# Patient Record
Sex: Male | Born: 1955 | Race: Black or African American | Hispanic: No | Marital: Married | Smoking: Current every day smoker
Health system: Southern US, Community
[De-identification: ages and names within clinical notes are randomized; demographics above are authoritative.]

## PROBLEM LIST (undated history)

## (undated) HISTORY — PX: MENISCUS REPAIR: SHX5179

## (undated) HISTORY — PX: OTHER SURGICAL HISTORY: SHX169

---

## 2009-07-25 ENCOUNTER — Emergency Department: Payer: Self-pay | Admitting: Emergency Medicine

## 2013-07-19 HISTORY — PX: COLONOSCOPY: SHX174

## 2017-08-15 ENCOUNTER — Other Ambulatory Visit: Payer: Self-pay

## 2017-08-15 ENCOUNTER — Emergency Department: Payer: Worker's Compensation

## 2017-08-15 ENCOUNTER — Emergency Department
Admission: EM | Admit: 2017-08-15 | Discharge: 2017-08-15 | Disposition: A | Discharge status: Home / Self Care | Payer: Worker's Compensation | Attending: Emergency Medicine | Admitting: Emergency Medicine

## 2017-08-15 ENCOUNTER — Encounter: Payer: Self-pay | Admitting: Emergency Medicine

## 2017-08-15 DIAGNOSIS — Y929 Unspecified place or not applicable: Secondary | ICD-10-CM | POA: Diagnosis not present

## 2017-08-15 DIAGNOSIS — S86001A Unspecified injury of right Achilles tendon, initial encounter: Secondary | ICD-10-CM | POA: Diagnosis not present

## 2017-08-15 DIAGNOSIS — Y999 Unspecified external cause status: Secondary | ICD-10-CM | POA: Diagnosis not present

## 2017-08-15 DIAGNOSIS — S99911A Unspecified injury of right ankle, initial encounter: Secondary | ICD-10-CM | POA: Diagnosis present

## 2017-08-15 DIAGNOSIS — Y9367 Activity, basketball: Secondary | ICD-10-CM | POA: Insufficient documentation

## 2017-08-15 DIAGNOSIS — F17200 Nicotine dependence, unspecified, uncomplicated: Secondary | ICD-10-CM | POA: Insufficient documentation

## 2017-08-15 DIAGNOSIS — Y33XXXA Other specified events, undetermined intent, initial encounter: Secondary | ICD-10-CM | POA: Diagnosis not present

## 2017-08-15 MED ORDER — HYDROCODONE-ACETAMINOPHEN 5-325 MG PO TABS
1.0000 | ORAL_TABLET | Freq: Once | ORAL | Status: AC
  Administered 2017-08-15: 1 via ORAL
  Filled 2017-08-15: qty 1

## 2017-08-15 MED ORDER — HYDROCODONE-ACETAMINOPHEN 5-325 MG PO TABS: 1.0000 | ORAL_TABLET | Freq: Four times a day (QID) | ORAL | 0 refills | Status: AC | PRN

## 2017-08-15 NOTE — ED Notes (Signed)
X-ray at bedside

## 2017-08-15 NOTE — ED Triage Notes (Signed)
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 EMS administered 100 mcg of Fentanyl pain.  Pt is awake alert and oriented.

## 2017-08-15 NOTE — ED Provider Notes (Signed)
[facility] Emergency Department Provider Note  ____________________________________________  Time seen: Approximately 9:23 PM  I have reviewed the triage vital signs and the nursing notes.   HISTORY  Chief Complaint Ankle Pain    HPI Erik [name] is a 62 y.o. male presents to the emergency department with right ankle pain.  Patient reports that he was demonstrating basketball moves when he suddenly felt posterior right ankle pain and heard a "pop".  Patient reports that he has been unable to bear weight since incident.  He denies weakness, radiculopathy or changes in sensation of the lower extremities.  No skin compromise.  No history of prior right ankle trauma.  He describes his pain as 8 out of 10 in intensity, nonradiating and aching.  No medications were attempted prior to presenting to the emergency department.   History reviewed. No pertinent past medical history.  There are no active problems to display for this patient.   History reviewed. No pertinent surgical history.  Prior to Admission medications   Medication Sig Start Date End Date Taking? Authorizing Provider  HYDROcodone-acetaminophen (NORCO) 5-325 MG tablet Take 1 tablet by mouth every 6 (six) hours as needed for up to 5 days for moderate pain. 08/15/17 08/20/17  Orvil FeilWoods, Lannie Yusuf M, PA-C    Allergies Patient has no allergy information on record.  No family history on file.  Social History Social History   Tobacco Use  . Smoking status: Current Every Day Smoker  Substance Use Topics  . Alcohol use: No    Frequency: Never  . Drug use: Not on file     Review of Systems  Constitutional: No fever/chills Eyes: No visual changes. No discharge ENT: No upper respiratory complaints. Cardiovascular: no chest pain. Respiratory: no cough. No SOB. Musculoskeletal: Patient has right ankle pain.  Skin: Negative for rash, abrasions, lacerations, ecchymosis. Neurological: No weakness or  numbness   ____________________________________________   PHYSICAL EXAM:  VITAL SIGNS: ED Triage Vitals  Enc Vitals Group     BP 08/15/17 1915 (!) 144/71     Pulse Rate 08/15/17 1915 76     Resp 08/15/17 1915 18     Temp 08/15/17 1915 98 F (36.7 C)     Temp Source 08/15/17 1915 Oral     SpO2 08/15/17 1915 99 %     Weight 08/15/17 1916 215 lb (97.5 kg)     Height 08/15/17 1916 5\' 11"  (1.803 m)     Head Circumference --      Peak Flow --      Pain Score 08/15/17 1916 6     Pain Loc --      Pain Edu? --      Excl. in GC? --      Constitutional: Alert and oriented. Well appearing and in no acute distress. Eyes: Conjunctivae are normal. PERRL. EOMI. Head: Atraumatic. Cardiovascular: Normal rate, regular rhythm. Normal S1 and S2.  Good peripheral circulation. Respiratory: Normal respiratory effort without tachypnea or retractions. Lungs CTAB. Good air entry to the bases with no decreased or absent breath sounds. Musculoskeletal: Patient is able to perform limited range of motion at the right ankle, likely secondary to pain.  He is able to move all 5 right toes.  Patient has a palpable deficit at the insertion of the Achilles tendon.  Tenderness elicited with palpation of the proximal calf.  Palpable dorsalis pedis pulse, right. Neurologic:  Normal speech and language. No [name] focal neurologic deficits are appreciated.  Skin:  Skin is warm, dry and intact. No rash noted.  ____________________________________________   LABS (all labs ordered are listed, but only abnormal results are displayed)  Labs Reviewed - No data to display ____________________________________________  EKG   ____________________________________________  RADIOLOGY Geraldo Pitter, personally viewed and evaluated these images (plain radiographs) as part of my medical decision making, as well as reviewing the written report by the radiologist.  Dg Ankle Complete Right  Result Date:  08/15/2017 CLINICAL DATA:  Right ankle pain EXAM: RIGHT ANKLE - COMPLETE 3+ VIEW COMPARISON:  None. FINDINGS: Soft tissue calcification posteriorly, possibly within the Achilles tendon, likely related to old injury. No acute bony abnormality. No fracture, subluxation or dislocation. Soft tissues are intact. IMPRESSION: Calcification posteriorly appears to be within the Achilles tendon, likely related to old injury. No acute bony abnormality. Electronically Signed   By: Charlett Nose M.D.   On: 08/15/2017 19:44    ____________________________________________    PROCEDURES  Procedure(s) performed:    Procedures    Medications  HYDROcodone-acetaminophen (NORCO/VICODIN) 5-325 MG per tablet 1 tablet (1 tablet Oral Given 08/15/17 2059)     ____________________________________________   INITIAL IMPRESSION / ASSESSMENT AND PLAN / ED COURSE  Pertinent labs & imaging results that were available during my care of the patient were reviewed by me and considered in my medical decision making (see chart for details).  Review of the [place] CSRS was performed in accordance of the [facility] prior to dispensing any controlled drugs.     Assessment and plan Achilles tendon rupture Patient presents to the emergency department with right ankle pain.  Differential diagnosis included fracture versus Achilles tendon rupture versus ankle sprain.  Physical exam findings were concerning for a palpable deficit at the insertion of the Achilles tendon.  No fractures or acute abnormalities were visualized on x-ray examination of the right ankle.  Patient was splinted in the emergency department.  Patient was discharged with Norco for pain.  A referral was given to orthopedics.  Return precautions were given.  All patient questions were answered.   ____________________________________________  FINAL CLINICAL IMPRESSION(S) / ED DIAGNOSES  Final diagnoses:  Injury of right Achilles tendon, initial encounter       NEW MEDICATIONS STARTED DURING THIS VISIT:  ED Discharge Orders        Ordered    HYDROcodone-acetaminophen (NORCO) 5-325 MG tablet  Every 6 hours PRN     08/15/17 2053          This chart was dictated using voice recognition software/Dragon. Despite best efforts to proofread, errors can occur which can change the meaning. Any change was purely unintentional.    Gasper Lloyd 08/15/17 2127    Jeanmarie Plant, MD 08/15/17 2145

## 2017-08-16 DIAGNOSIS — S86019A Strain of unspecified Achilles tendon, initial encounter: Secondary | ICD-10-CM | POA: Insufficient documentation

## 2017-08-16 DIAGNOSIS — Z720 Tobacco use: Secondary | ICD-10-CM | POA: Insufficient documentation

## 2017-08-29 DIAGNOSIS — S86919A Strain of unspecified muscle(s) and tendon(s) at lower leg level, unspecified leg, initial encounter: Secondary | ICD-10-CM | POA: Insufficient documentation

## 2018-06-08 IMAGING — DX DG ANKLE COMPLETE 3+V*R*
3 series · 3 of 3 positions shown · non-contrast
Comparison: None.

CLINICAL DATA: Right ankle pain

EXAM:
RIGHT ANKLE - COMPLETE 3+ VIEW

[ankle ap]
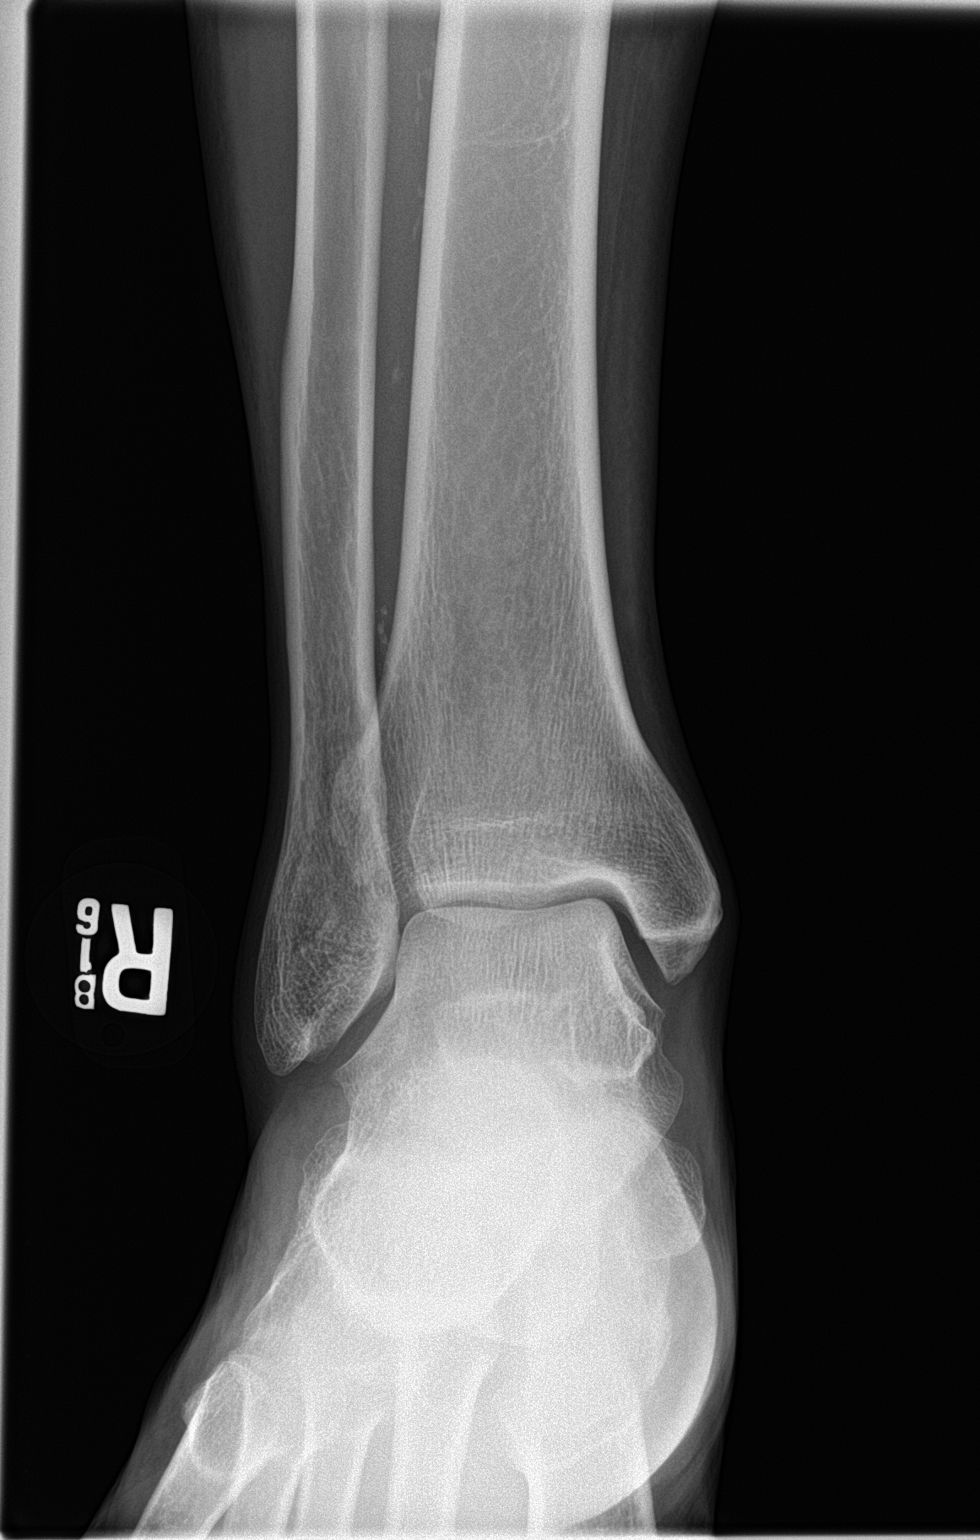

[ankle obl]
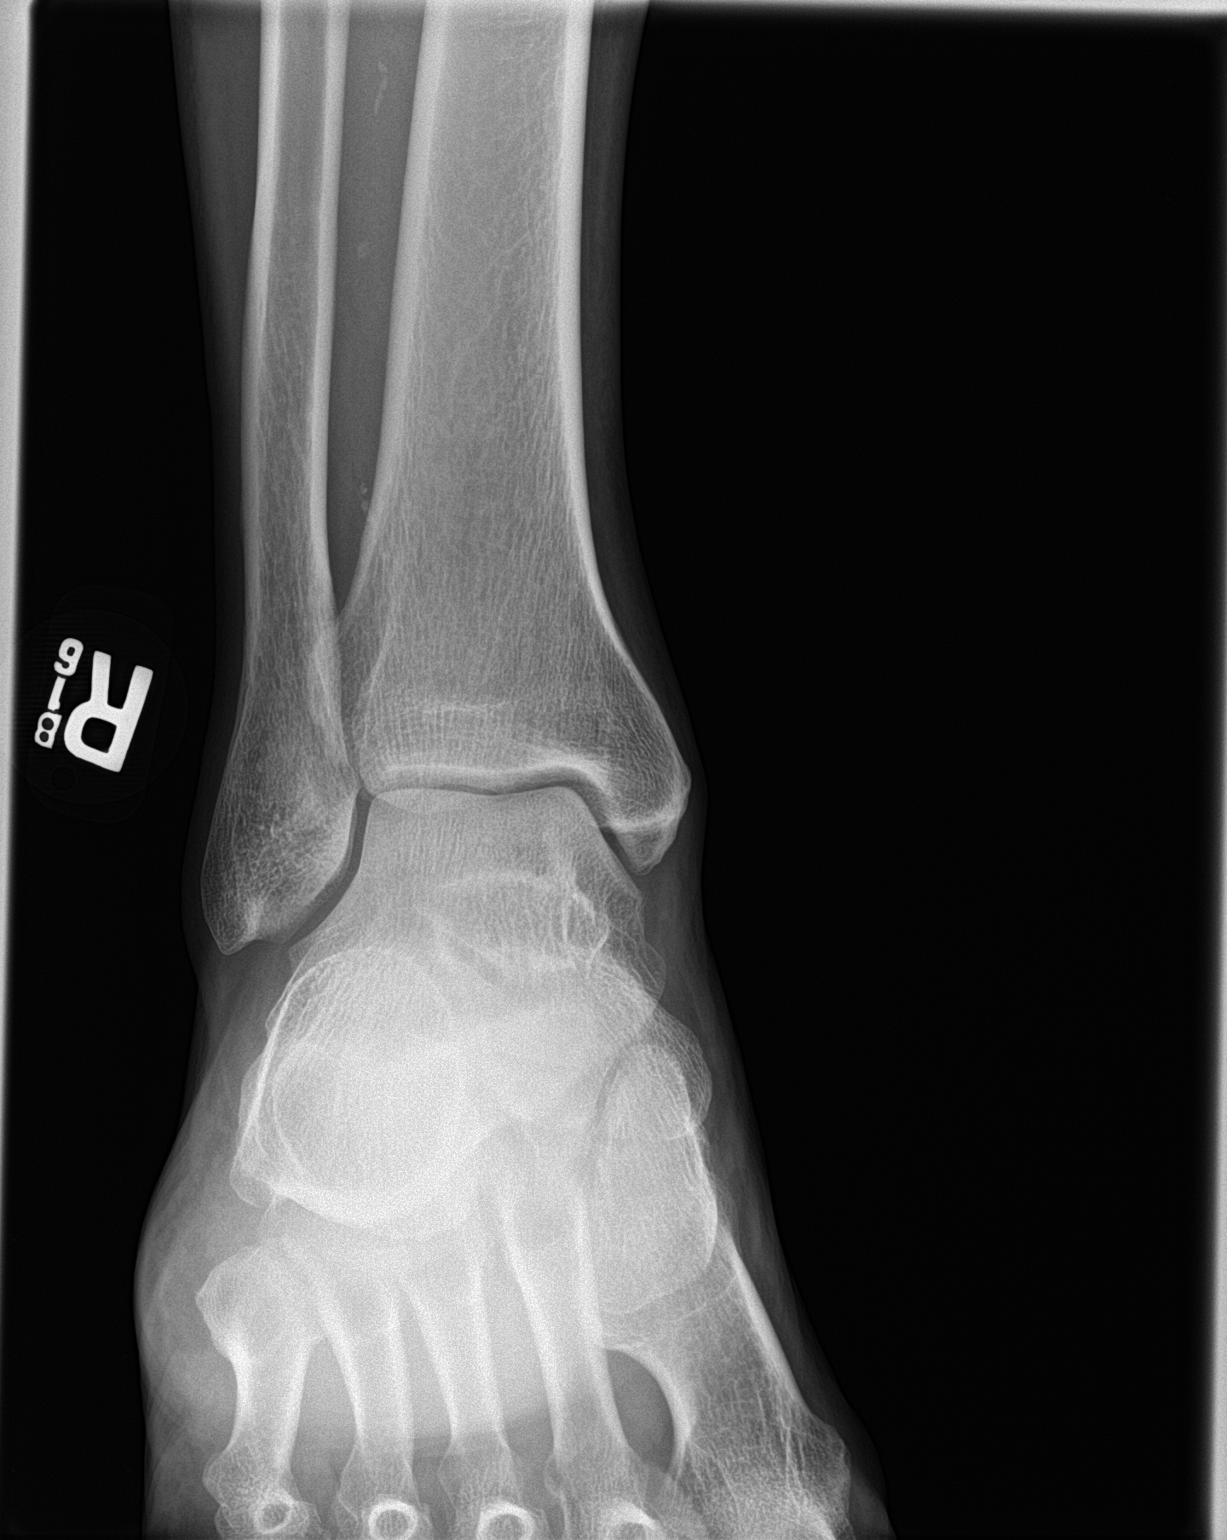

[ankle lat]
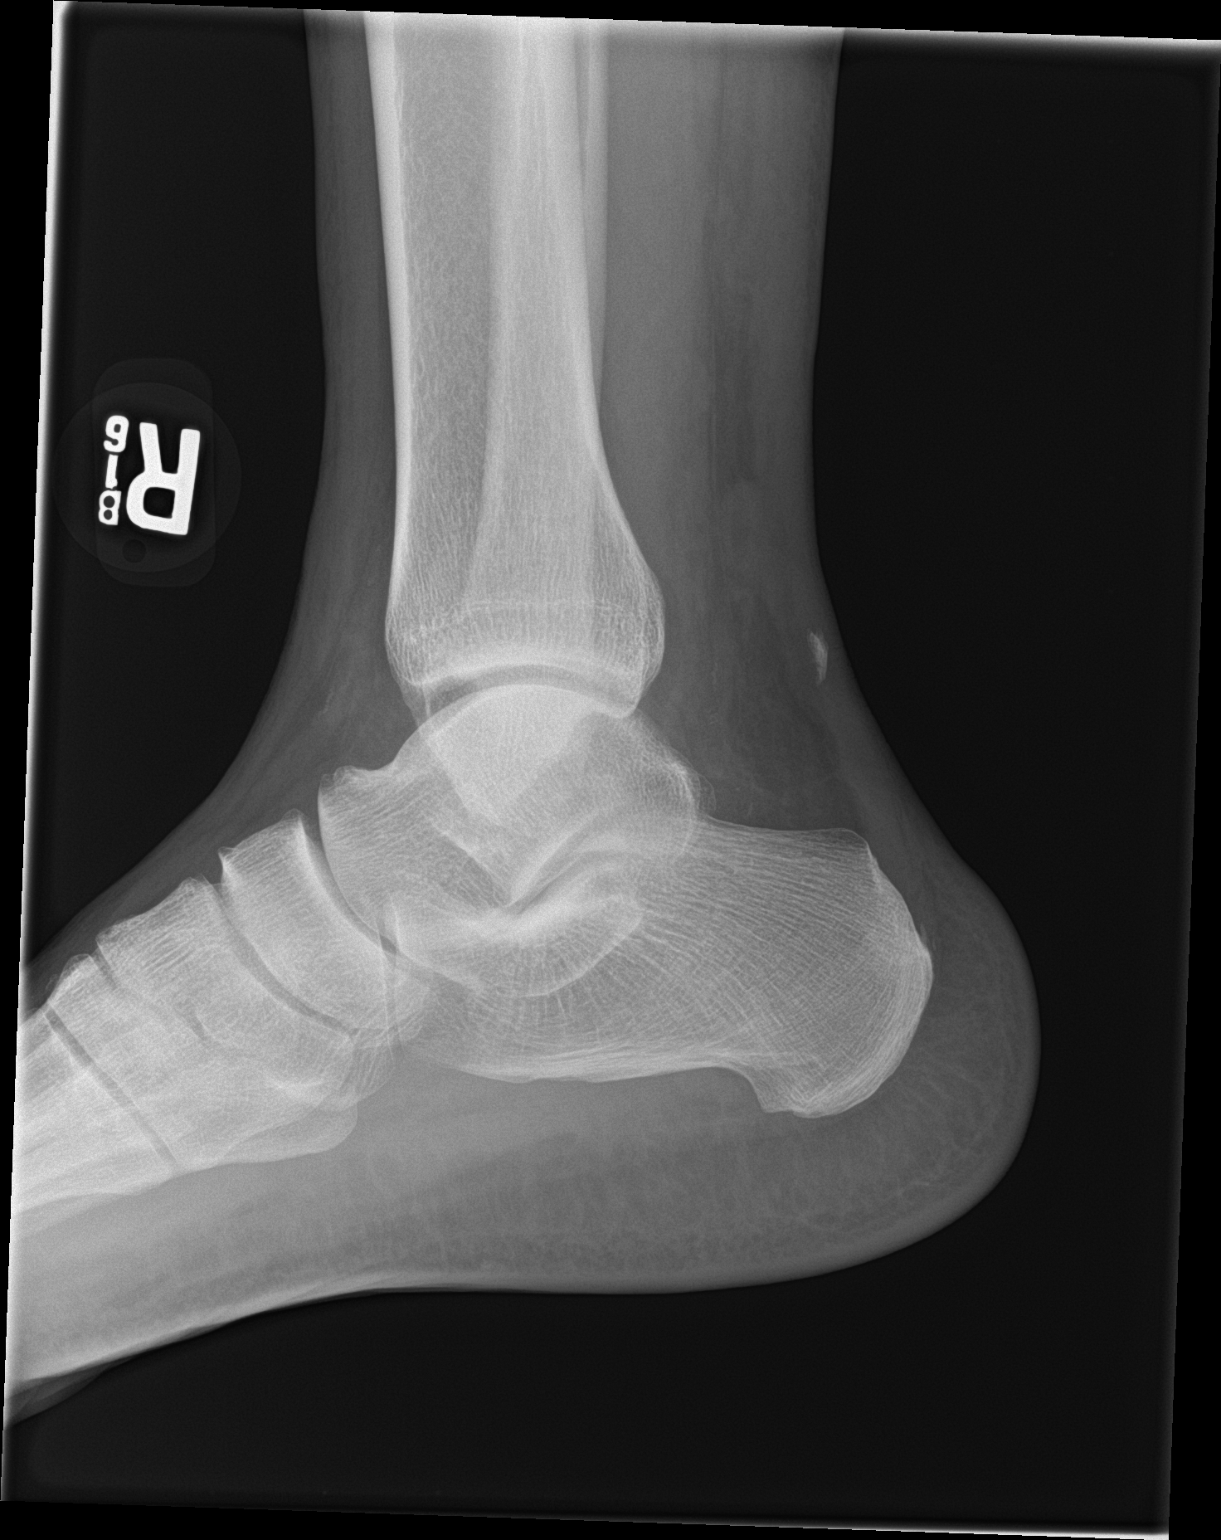

[3 of 3 positions shown; findings below may reference images not displayed]

FINDINGS: Soft tissue calcification posteriorly, possibly within the Achilles
tendon, likely related to old injury. No acute bony abnormality. No
fracture, subluxation or dislocation. Soft tissues are intact.
IMPRESSION: Calcification posteriorly appears to be within the Achilles tendon,
likely related to old injury.

No acute bony abnormality.

## 2019-07-30 ENCOUNTER — Other Ambulatory Visit: Payer: Self-pay

## 2019-07-30 ENCOUNTER — Encounter: Payer: Self-pay | Admitting: Occupational Medicine

## 2019-07-30 ENCOUNTER — Ambulatory Visit: Payer: Self-pay | Admitting: Occupational Medicine

## 2019-07-30 VITALS — BP 138/70 | HR 66 | Temp 98.0°F | Resp 16 | Ht 71.0 in | Wt 218.0 lb

## 2019-07-30 DIAGNOSIS — Z09 Encounter for follow-up examination after completed treatment for conditions other than malignant neoplasm: Secondary | ICD-10-CM

## 2019-07-30 DIAGNOSIS — Z0000 Encounter for general adult medical examination without abnormal findings: Secondary | ICD-10-CM

## 2019-07-30 DIAGNOSIS — E663 Overweight: Secondary | ICD-10-CM | POA: Insufficient documentation

## 2019-07-30 DIAGNOSIS — E119 Type 2 diabetes mellitus without complications: Secondary | ICD-10-CM

## 2019-07-30 DIAGNOSIS — H01139 Eczematous dermatitis of unspecified eye, unspecified eyelid: Secondary | ICD-10-CM | POA: Insufficient documentation

## 2019-07-30 DIAGNOSIS — N529 Male erectile dysfunction, unspecified: Secondary | ICD-10-CM | POA: Insufficient documentation

## 2019-07-30 DIAGNOSIS — D649 Anemia, unspecified: Secondary | ICD-10-CM | POA: Insufficient documentation

## 2019-07-30 DIAGNOSIS — E785 Hyperlipidemia, unspecified: Secondary | ICD-10-CM

## 2019-07-30 DIAGNOSIS — B351 Tinea unguium: Secondary | ICD-10-CM | POA: Insufficient documentation

## 2019-07-30 LAB — POCT URINALYSIS DIPSTICK
Bilirubin, UA: NEGATIVE
Blood, UA: NEGATIVE
Glucose, UA: NEGATIVE
Ketones, UA: NEGATIVE
Leukocytes, UA: NEGATIVE
Nitrite, UA: NEGATIVE
Protein, UA: NEGATIVE
Spec Grav, UA: >=1.03 — AB (ref 1.010–1.025)
Urobilinogen, UA: 0.2 E.U./dL
pH, UA: 6 (ref 5.0–8.0)

## 2019-07-30 LAB — POCT GLYCOSYLATED HEMOGLOBIN (HGB A1C)
HbA1c, POC (prediabetic range): 6.7 % — AB (ref 5.7–6.4)
Hemoglobin A1C: 6.7 % — AB (ref 4.0–5.6)

## 2019-07-30 LAB — GLUCOSE, POCT (MANUAL RESULT ENTRY): POC Glucose: 120 mg/dl — AB (ref 70–99)

## 2019-07-30 NOTE — Patient Instructions (Signed)
Thank you for coming to [name].  You have been diagnosed with Type 2 Diabetes Mellitus aka diabetes.  Blood work was performed today. We will call you with the results and discuss starting medication treatment.  We are going to schedule a meeting with a diabetic counselor to discuss diet and exercise changes, as well as, how to check your sugar (glucose) at home.  You have been scheduled for a follow-up appointment at the clinic for your annual physical and repeat check of your sugar (glucose) to check your progress.  Please feel free to call the clinic with any questions/concerns.  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  Diabetes Mellitus and Standards of Medical Care Managing diabetes (diabetes mellitus) can be complicated. Your diabetes treatment may be managed by a team of health care providers, including:  A physician who specializes in diabetes (endocrinologist).  A nurse practitioner or physician assistant.  Nurses.  A diet and nutrition specialist (registered dietitian).  A certified diabetes educator (CDE).  An exercise specialist.  A pharmacist.  An eye doctor.  A foot specialist (podiatrist).  A dentist.  A primary care provider.  A mental health provider. Your health care providers follow guidelines to help you get the best quality of care. The following schedule is a general guideline for your diabetes management plan. Your health care providers may give you more specific instructions. Physical exams Upon being diagnosed with diabetes mellitus, and each year after that, your health care provider will ask about your medical and family history. He or she will also do a physical exam. Your exam may include:  Measuring your height, weight,  and body mass index (BMI).  Checking your blood pressure. This will be done at every routine medical visit. Your target blood pressure may vary depending on your medical conditions, your age, and other factors.  Thyroid gland exam.  Skin exam.  Screening for damage to your nerves (peripheral neuropathy). This may include checking the pulse in your legs and feet and checking the level of sensation in your hands and feet.  A complete foot exam to inspect the structure and skin of your feet, including checking for cuts, bruises, redness, blisters, sores, or other problems.  Screening for blood vessel (vascular) problems, which may include checking the pulse in your legs and feet and checking your temperature. Blood tests Depending on your treatment plan and your personal needs, you may have the following tests done:  HbA1c (hemoglobin A1c). This test provides information about blood sugar (glucose) control over the previous 2-3 months. It is used to adjust your treatment plan, if needed. This test will be done: ? At least 2 times a year, if you are meeting your treatment goals. ? 4 times a year, if you are not meeting your treatment goals or if treatment goals have changed.  Lipid testing, including total, LDL, and HDL cholesterol and triglyceride levels. ? The goal for LDL is less than 100 mg/dL (5.5 mmol/L). If you are at high risk for complications, the goal is less than 70 mg/dL (3.9 mmol/L). ? The goal for HDL is 40 mg/dL (2.2 mmol/L) or higher for men and 50 mg/dL (2.8 mmol/L) or higher for women. An HDL cholesterol of 60 mg/dL (3.3 mmol/L) or higher gives some protection against  heart disease. ? The goal for triglycerides is less than 150 mg/dL (8.3 mmol/L).  Liver function tests.  Kidney function tests.  Thyroid function tests. Dental and eye exams  Visit your dentist two times a year.  If you have type 1 diabetes, your health care provider may recommend an eye exam 3-5 years  after you are diagnosed, and then once a year after your first exam. ? For children with type 1 diabetes, a health care provider may recommend an eye exam when your child is age 73 or older and has had diabetes for 3-5 years. After the first exam, your child should get an eye exam once a year.  If you have type 2 diabetes, your health care provider may recommend an eye exam as soon as you are diagnosed, and then once a year after your first exam. Immunizations   The yearly flu (influenza) vaccine is recommended for everyone 6 months or older who has diabetes.  The pneumonia (pneumococcal) vaccine is recommended for everyone 2 years or older who has diabetes. If you are 65 or older, you may get the pneumonia vaccine as a series of two separate shots.  The hepatitis B vaccine is recommended for adults shortly after being diagnosed with diabetes.  Adults and children with diabetes should receive all other vaccines according to age-specific recommendations from the Centers for Disease Control and Prevention (CDC). Mental and emotional health Screening for symptoms of eating disorders, anxiety, and depression is recommended at the time of diagnosis and afterward as needed. If your screening shows that you have symptoms (positive screening result), you may need more evaluation and you may work with a mental health care provider. Treatment plan Your treatment plan will be reviewed at every medical visit. You and your health care provider will discuss:  How you are taking your medicines, including insulin.  Any side effects you are experiencing.  Your blood glucose target goals.  The frequency of your blood glucose monitoring.  Lifestyle habits, such as activity level as well as tobacco, alcohol, and substance use. Diabetes self-management education Your health care provider will assess how well you are monitoring your blood glucose levels and whether you are taking your insulin correctly. He or  she may refer you to:  A certified diabetes educator to manage your diabetes throughout your life, starting at diagnosis.  A registered dietitian who can create or review your personal nutrition plan.  An exercise specialist who can discuss your activity level and exercise plan. Summary  Managing diabetes (diabetes mellitus) can be complicated. Your diabetes treatment may be managed by a team of health care providers.  Your health care providers follow guidelines in order to help you get the best quality of care.  Standards of care including having regular physical exams, blood tests, blood pressure monitoring, immunizations, screening tests, and education about how to manage your diabetes.  Your health care providers may also give you more specific instructions based on your individual health. This information is not intended to replace advice given to you by your health care provider. Make sure you discuss any questions you have with your health care provider. Document Revised: 03/24/2018 Document Reviewed: 04/02/2016 Elsevier Patient Education  2020 [employer].   Carbohydrate Counting for Diabetes Mellitus, Adult  Carbohydrate counting is a method of keeping track of how many carbohydrates you eat. Eating carbohydrates naturally increases the amount of sugar (glucose) in the blood. Counting how many carbohydrates you eat helps keep your blood glucose within  normal limits, which helps you manage your diabetes (diabetes mellitus). It is important to know how many carbohydrates you can safely have in each meal. This is different for every person. A diet and nutrition specialist (registered dietitian) can help you make a meal plan and calculate how many carbohydrates you should have at each meal and snack. Carbohydrates are found in the following foods:  Grains, such as breads and cereals.  Dried beans and soy products.  Starchy vegetables, such as potatoes, peas, and corn.  Fruit  and fruit juices.  Milk and yogurt.  Sweets and snack foods, such as cake, cookies, candy, chips, and soft drinks. How do I count carbohydrates? There are two ways to count carbohydrates in food. You can use either of the methods or a combination of both. Reading "Nutrition Facts" on packaged food The "Nutrition Facts" list is included on the labels of almost all packaged foods and beverages in the [place] It includes:  The serving size.  Information about nutrients in each serving, including the grams (g) of carbohydrate per serving. To use the "Nutrition Facts":  Decide how many servings you will have.  Multiply the number of servings by the number of carbohydrates per serving.  The resulting number is the total amount of carbohydrates that you will be having. Learning standard serving sizes of other foods When you eat carbohydrate foods that are not packaged or do not include "Nutrition Facts" on the label, you need to measure the servings in order to count the amount of carbohydrates:  Measure the foods that you will eat with a food scale or measuring cup, if needed.  Decide how many standard-size servings you will eat.  Multiply the number of servings by 15. Most carbohydrate-rich foods have about 15 g of carbohydrates per serving. ? For example, if you eat 8 oz (170 g) of strawberries, you will have eaten 2 servings and 30 g of carbohydrates (2 servings x 15 g = 30 g).  For foods that have more than one food mixed, such as soups and casseroles, you must count the carbohydrates in each food that is included. The following list contains standard serving sizes of common carbohydrate-rich foods. Each of these servings has about 15 g of carbohydrates:   hamburger bun or  English muffin.   oz (15 mL) syrup.   oz (14 g) jelly.  1 slice of bread.  1 six-inch tortilla.  3 oz (85 g) cooked rice or pasta.  4 oz (113 g) cooked dried beans.  4 oz (113 g) starchy vegetable,  such as peas, corn, or potatoes.  4 oz (113 g) hot cereal.  4 oz (113 g) mashed potatoes or  of a large baked potato.  4 oz (113 g) canned or frozen fruit.  4 oz (120 mL) fruit juice.  4-6 crackers.  6 chicken nuggets.  6 oz (170 g) unsweetened dry cereal.  6 oz (170 g) plain fat-free yogurt or yogurt sweetened with artificial sweeteners.  8 oz (240 mL) milk.  8 oz (170 g) fresh fruit or one small piece of fruit.  24 oz (680 g) popped popcorn. Example of carbohydrate counting Sample meal  3 oz (85 g) chicken breast.  6 oz (170 g) brown rice.  4 oz (113 g) corn.  8 oz (240 mL) milk.  8 oz (170 g) strawberries with sugar-free whipped topping. Carbohydrate calculation 1. Identify the foods that contain carbohydrates: ? Rice. ? Corn. ? Milk. ? Strawberries. 2. Calculate how  many servings you have of each food: ? 2 servings rice. ? 1 serving corn. ? 1 serving milk. ? 1 serving strawberries. 3. Multiply each number of servings by 15 g: ? 2 servings rice x 15 g = 30 g. ? 1 serving corn x 15 g = 15 g. ? 1 serving milk x 15 g = 15 g. ? 1 serving strawberries x 15 g = 15 g. 4. Add together all of the amounts to find the total grams of carbohydrates eaten: ? 30 g + 15 g + 15 g + 15 g = 75 g of carbohydrates total. Summary  Carbohydrate counting is a method of keeping track of how many carbohydrates you eat.  Eating carbohydrates naturally increases the amount of sugar (glucose) in the blood.  Counting how many carbohydrates you eat helps keep your blood glucose within normal limits, which helps you manage your diabetes.  A diet and nutrition specialist (registered dietitian) can help you make a meal plan and calculate how many carbohydrates you should have at each meal and snack. This information is not intended to replace advice given to you by your health care provider. Make sure you discuss any questions you have with your health care provider. Document  Revised: 01/27/2017 Document Reviewed: 12/17/2015 Elsevier Patient Education  2020 [employer].

## 2019-07-30 NOTE — Progress Notes (Signed)
Patient ID: Erik [name] DOB: September 01, 1955 AGE: 64 y.o. MRN: [id number]   PCP: No primary care provider on file.   Chief Complaint:  Chief Complaint  Patient presents with  . Follow-up    Elevated BS at [employer] Physical - A1c Now Followup & Form  . Covid Screening    No Symptoms     Subjective:    HPI:  Erik [name] is a 64 y.o. male presents for evaluation  Chief Complaint  Patient presents with  . Follow-up    Elevated BS at [employer] Physical - A1c Now Followup & Form  . 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 Physical performed at [facility] in [place], [place]. Glycosuria noted. Random blood glucose was 220. Patient given form to have completed by PCP.  Patient last seen at [facility] May 2019 for annual physical. A1C 5.7. Fasting blood glucose 117.  Patient has noticed polyuria and nocturia. Denies headache, dizziness/lightheadedness, change in vision, chest pain, SOB, hematuria, nausea/vomiting, abdominal pain, gait abnormality, extremity weakness. Patient does report occasional tingling in fingers and toes; not necessarily worse over past 1-2 years.  Patient admits to poor diet. Eats bread frequently; almost every meal. Unsure if DM2 runs in his family.  A limited review of symptoms was performed, pertinent positives and negatives as mentioned in HPI.  The following portions of the patient's history were reviewed and updated as appropriate: allergies, current medications and past medical history.  Patient Active Problem List   Diagnosis Date Noted  . Overweight 07/30/2019  . Anemia 07/30/2019  . Hyperlipidemia 07/30/2019  . Erectile dysfunction 07/30/2019  . Eczematous dermatitis of eyelid 07/30/2019  . Tinea unguium 07/30/2019  . Muscle strain of lower extremity 08/29/2017  . Strain of Achilles tendon 08/16/2017  .  Tobacco user 08/16/2017    Allergies  Allergen Reactions  . Bee Venom Swelling    No current outpatient medications on file prior to visit.   No current facility-administered medications on file prior to visit.       Objective:   Vitals:   07/30/19 0938  BP: 138/70  Pulse: 66  Resp: 16  Temp: 98 F (36.7 C)  SpO2: 98%     Wt Readings from Last 3 Encounters:  07/30/19 218 lb (98.9 kg)  08/15/17 215 lb (97.5 kg)    Physical Exam:   General Appearance:  Patient sitting comfortably on examination table. Conversational. Peri Jefferson self-historian. In no acute distress. Afebrile.   Head:  Normocephalic, without obvious abnormality, atraumatic  Eyes:  PERRL, conjunctiva/corneas clear, EOM's intact  Ears:  Left ear canal WNL. No erythema or edema. No open wound. No visible purulent drainage. No tenderness with palpation over left tragus or with manipulation of left auricle. No visible erythema or edema of left mastoid. No tenderness with palpation over left mastoid. Right ear canal WNL. No erythema or edema. No open wound. No visible purulent drainage. No tenderness with palpation over right tragus or with manipulation of right auricle. No visible erythema or edema of right mastoid. No tenderness with palpation over right mastoid. Left TM WNL. Good light reflex. Visible landmarks. No erythema. No injection. No bulging or retraction. No visible perforation. No serous effusion. No visible purulent effusion. No tympanostomy tube. No scar tissue. Right TM WNL. Good light reflex. Visible landmarks. No erythema. No injection.  No bulging or retraction. No visible perforation. No serous effusion. No visible purulent effusion. No tympanostomy tube. No scar tissue.  Nose: Nares normal. Septum midline. No visible polyps. No discharge. Normal mucosa. No sinus tenderness with percussion/palpation.  Throat: Lips, mucosa, and tongue normal; teeth and gums normal. Throat reveals no erythema. No postnasal  drip. No visible cobblestoning. Tonsils with no enlargement or exudate. Uvula midline with no edema or erythema.  Neck: Supple, symmetrical, trachea midline, no adenopathy  Lungs:   Clear to auscultation bilaterally, respirations unlabored. Good aeration. No rales, rhonchi, crackles or wheezing.  Heart:  Regular rate and rhythm, S1 and S2 normal, no murmur, rub, or gallop  Abdomen:   Normal to inspection. Normoactive bowel sounds. No tenderness with palpation. No guarding, rigidity or rebound tenderness. No palpable organomegaly.  Extremities: Extremities normal, atraumatic, no cyanosis or edema  Pulses: 2+ and symmetric  Skin: Skin color, texture, turgor normal, no rashes or lesions  Lymph nodes: Cervical, supraclavicular, and axillary nodes normal  Neurologic: Normal    Assessment & Plan:    Exam findings, diagnosis etiology and medication use and indications reviewed with patient. Follow-Up and discharge instructions provided. No emergent/urgent issues found on exam.  Patient education was provided.   Patient verbalized understanding of information provided and agrees with plan of care (POC), all questions answered. The patient is advised to call or return to clinic if condition does not see an improvement in symptoms, or to seek the care of the closest emergency department if condition worsens with the below plan.   Orders Placed This Encounter  Procedures  . POCT HgB A1C (CPT 83036)  . POCT glucose (manual entry)    Results for orders placed or performed in visit on 07/30/19  POCT HgB A1C (CPT 83036)  Result Value Ref Range   Hemoglobin A1C 6.7 (A) 4.0 - 5.6 %   HbA1c POC (<> result, manual entry)     HbA1c, POC (prediabetic range) 6.7 (A) 5.7 - 6.4 %   HbA1c, POC (controlled diabetic range)    POCT glucose (manual entry)  Result Value Ref Range   POC Glucose 120 (A) 70 - 99 mg/dl    1. Follow up - POCT HgB A1C (CPT 83036) - POCT glucose (manual entry)  2. Type 2  diabetes mellitus without complication, without long-term current use of insulin ([place])  3. Hyperlipidemia, unspecified hyperlipidemia type  4. Overweight  5. Routine adult health maintenance - Male Executive Panel - POCT Urinalysis Dipstick (CPT 81002) - Microalbumin / creatinine urine ratio  Patient with new diagnosis of Type 2 Diabetes Mellitus.  Blood work ordered; if GFR/BUN/Creatinine WNL (no proteinuria on UA performed today), patient can be started on Metformin XR 500mg  qd (discussed with patient, he would prefer qd as opposed to bid dosing).  May wish to discuss starting Lisinopril 5mg  qd (kidney protection), statin (if cholesterol elevated) and ASA 81mg  qd.  Patient referred to diabetic counselor/nutritionist; to discuss diet/carbohydrate counting, use of glucometer, and diabetes lifestyle basics.  Discussed with patient weight loss and smoking cessation.  Completed [employer] form.  Advised patient call clinic with any concerns. Annual physical scheduled for May 2021 (can also recheck BMP and A1C). Will need to discuss need for optometry/ophthalmology appt and possibly podiatry appt.   Darlin Priestly, MHS, PA-C Montey Hora, MHS, PA-C Advanced Practice Provider [facility]  [address] [place], [place] [address] (p): [phone] [email or web address]

## 2019-07-31 LAB — CMP12+LP+TP+TSH+6AC+PSA+CBC…
ALT: 38 IU/L (ref 0–44)
AST: 41 IU/L — ABNORMAL HIGH (ref 0–40)
Albumin/Globulin Ratio: 1.6 (ref 1.2–2.2)
Albumin: 4.7 g/dL (ref 3.8–4.8)
Alkaline Phosphatase: 84 IU/L (ref 39–117)
BUN/Creatinine Ratio: 14 (ref 10–24)
BUN: 15 mg/dL (ref 8–27)
Basophils Absolute: 0 10*3/uL (ref 0.0–0.2)
Basos: 1 %
Bilirubin Total: 0.3 mg/dL (ref 0.0–1.2)
Calcium: 9.8 mg/dL (ref 8.6–10.2)
Chloride: 103 mmol/L (ref 96–106)
Chol/HDL Ratio: 4.2 ratio (ref 0.0–5.0)
Cholesterol, Total: 176 mg/dL (ref 100–199)
Creatinine, Ser: 1.05 mg/dL (ref 0.76–1.27)
EOS (ABSOLUTE): 0.2 10*3/uL (ref 0.0–0.4)
Eos: 2 %
Estimated CHD Risk: 0.8 times avg. (ref 0.0–1.0)
Free Thyroxine Index: 1.7 (ref 1.2–4.9)
GFR calc Af Amer: 87 mL/min/{1.73_m2} (ref >59)
GFR calc non Af Amer: 75 mL/min/{1.73_m2} (ref >59)
GGT: 134 IU/L — ABNORMAL HIGH (ref 0–65)
Globulin, Total: 2.9 g/dL (ref 1.5–4.5)
Glucose: 118 mg/dL — ABNORMAL HIGH (ref 65–99)
HDL: 42 mg/dL (ref >39)
Hematocrit: 37.9 % (ref 37.5–51.0)
Hemoglobin: 12.8 g/dL — ABNORMAL LOW (ref 13.0–17.7)
Immature Grans (Abs): 0 10*3/uL (ref 0.0–0.1)
Immature Granulocytes: 0 %
Iron: 97 ug/dL (ref 38–169)
LDH: 227 IU/L — ABNORMAL HIGH (ref 121–224)
LDL Chol Calc (NIH): 115 mg/dL — ABNORMAL HIGH (ref 0–99)
Lymphocytes Absolute: 3.7 10*3/uL — ABNORMAL HIGH (ref 0.7–3.1)
Lymphs: 44 %
MCH: 29.3 pg (ref 26.6–33.0)
MCHC: 33.8 g/dL (ref 31.5–35.7)
MCV: 87 fL (ref 79–97)
Monocytes Absolute: 0.6 10*3/uL (ref 0.1–0.9)
Monocytes: 7 %
Neutrophils Absolute: 3.9 10*3/uL (ref 1.4–7.0)
Neutrophils: 46 %
Phosphorus: 3.7 mg/dL (ref 2.8–4.1)
Platelets: 288 10*3/uL (ref 150–450)
Potassium: 5 mmol/L (ref 3.5–5.2)
Prostate Specific Ag, Serum: 0.8 ng/mL (ref 0.0–4.0)
RBC: 4.37 x10E6/uL (ref 4.14–5.80)
RDW: 13.2 % (ref 11.6–15.4)
Sodium: 142 mmol/L (ref 134–144)
T3 Uptake Ratio: 22 % — ABNORMAL LOW (ref 24–39)
T4, Total: 7.5 ug/dL (ref 4.5–12.0)
TSH: 1.52 u[IU]/mL (ref 0.450–4.500)
Total Protein: 7.6 g/dL (ref 6.0–8.5)
Triglycerides: 101 mg/dL (ref 0–149)
Uric Acid: 7.2 mg/dL (ref 3.8–8.4)
VLDL Cholesterol Cal: 19 mg/dL (ref 5–40)
WBC: 8.3 10*3/uL (ref 3.4–10.8)

## 2019-07-31 LAB — MICROALBUMIN / CREATININE URINE RATIO
Creatinine, Urine: 166.6 mg/dL
Microalb/Creat Ratio: 12 mg/g creat (ref 0–29)
Microalbumin, Urine: 19.9 ug/mL

## 2019-08-13 ENCOUNTER — Ambulatory Visit: Payer: Self-pay | Admitting: Occupational Medicine

## 2019-08-13 ENCOUNTER — Other Ambulatory Visit: Payer: Self-pay

## 2019-08-13 VITALS — BP 130/80 | HR 68 | Temp 97.8°F | Ht 71.0 in | Wt 216.6 lb

## 2019-08-13 DIAGNOSIS — E119 Type 2 diabetes mellitus without complications: Secondary | ICD-10-CM

## 2019-08-13 DIAGNOSIS — E785 Hyperlipidemia, unspecified: Secondary | ICD-10-CM

## 2019-08-13 MED ORDER — ASPIRIN EC 81 MG PO TBEC: 81.0000 mg | DELAYED_RELEASE_TABLET | Freq: Every day | ORAL | 1 refills | Status: AC

## 2019-08-13 MED ORDER — METFORMIN HCL ER 500 MG PO TB24: 500.0000 mg | ORAL_TABLET | Freq: Every day | ORAL | 1 refills | Status: AC

## 2019-08-13 MED ORDER — LISINOPRIL 5 MG PO TABS: 5.0000 mg | ORAL_TABLET | Freq: Every day | ORAL | 1 refills | Status: AC

## 2019-08-13 MED ORDER — ATORVASTATIN CALCIUM 20 MG PO TABS: 20.0000 mg | ORAL_TABLET | Freq: Every day | ORAL | 1 refills | Status: AC

## 2019-08-13 NOTE — Progress Notes (Signed)
Patient ID: Erik [name] DOB: 1956/04/21 AGE: 64 y.o. MRN: [id number]   PCP: No primary care provider on file.   Chief Complaint:  Chief Complaint  Patient presents with  . Annual Exam  . [id number] screening    no symptoms     Subjective:    HPI:  Erik [name] is a 64 y.o. male presents for evaluation  Chief Complaint  Patient presents with  . Annual Exam  . [phone] screening    no symptoms    64 year old male returns to [facility] for discussion of DM2 management. 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  Patient seen at [place] clinic on 07/30/2019. A1C 6.7. Ordered additional blood work for further evaluation.  Patient with hyperlipidemia (LDL 115) and elevated blood pressure (past three readings: 138/70, 141/68, 153/73).  Patient states he feels well. Over past 2 weeks, since last office visit, has decreased simple carbohydrate intake. States he has not heard about nutritionist/diabetic counseling appointment yet.  Patient denies fever, chills, headache, dizziness/lightheadedness, chest pain, SOB, palpitations, abdominal pain, nausea/vomiting, diarrhea, rashes. Patient with continued occasional/rare tingling in fingers and toes; not necessarily worse over past 1-2 years.  A limited review of symptoms was performed, pertinent positives and negatives as mentioned in HPI.  The following portions of the patient's history were reviewed and updated as appropriate: allergies, current medications and past medical history.  Patient Active Problem List   Diagnosis Date Noted  . Overweight 07/30/2019  . Anemia 07/30/2019  . Hyperlipidemia 07/30/2019  . Erectile dysfunction 07/30/2019  . Eczematous dermatitis of eyelid 07/30/2019  . Tinea unguium 07/30/2019  . Muscle strain of lower extremity 08/29/2017  . Strain of Achilles tendon  08/16/2017  . Tobacco user 08/16/2017    Allergies  Allergen Reactions  . Bee Venom Swelling    No current outpatient medications on file prior to visit.   No current facility-administered medications on file prior to visit.       Objective:   Vitals:   08/13/19 1112  BP: 130/80  Pulse: 68  Temp: 97.8 F (36.6 C)  SpO2: 99%     Wt Readings from Last 3 Encounters:  08/13/19 216 lb 9.6 oz (98.2 kg)  07/30/19 218 lb (98.9 kg)  08/15/17 215 lb (97.5 kg)    Physical Exam:   General Appearance:  Patient sitting comfortably on examination table. Conversational. Kermit Balo self-historian. In no acute distress. Afebrile.   Head:  Normocephalic, without obvious abnormality, atraumatic  Eyes:  PERRL, conjunctiva/corneas clear, EOM's intact  Neck: Supple, symmetrical, trachea midline, no adenopathy  Lungs:   Clear to auscultation bilaterally, respirations unlabored. Good aeration. No rales, rhonchi, crackles or wheezing.  Heart:  Regular rate and rhythm, S1 and S2 normal, no murmur, rub, or gallop  Extremities: Extremities normal, atraumatic, no cyanosis or edema  Pulses: 2+ and symmetric  Skin: Skin color, texture, turgor normal, no rashes or lesions  Lymph nodes: Cervical, supraclavicular, and axillary nodes normal  Neurologic: Normal    Assessment & Plan:    Exam findings, diagnosis etiology and medication use and indications reviewed with patient. Follow-Up and discharge instructions provided. No emergent/urgent issues found on exam.  Patient education was provided.   Patient verbalized understanding of information provided and agrees with plan of care (POC), all questions answered. The patient is advised to call or return  to clinic if condition does not see an improvement in symptoms, or to seek the care of the closest emergency department if condition worsens with the below plan.    1. Type 2 diabetes mellitus without complication, without long-term current use of insulin  (HCC)  - metFORMIN (GLUCOPHAGE-XR) 500 MG 24 hr tablet; Take 1 tablet (500 mg total) by mouth daily with breakfast.  Dispense: 90 tablet; Refill: 1 - lisinopril (ZESTRIL) 5 MG tablet; Take 1 tablet (5 mg total) by mouth daily.  Dispense: 90 tablet; Refill: 1  2. Hyperlipidemia, unspecified hyperlipidemia type  - atorvastatin (LIPITOR) 20 MG tablet; Take 1 tablet (20 mg total) by mouth daily.  Dispense: 90 tablet; Refill: 38  64 year old male returns to [facility] clinic for initiating of DM2 management.  Baseline 10 years ASCVD Risk = 28.3% (AHA/ACC calculations). Advises initiating statin treatment. Patient with advanced age, African American race, elevated BP, elevated LDL, and current cigarette smoker.  Started Metformin XR 500mg  qd, Lisinopril 5mg  qd, Atorvastatin 20mg  qd, and ASA 81mg  qd. Follow-up scheduled in 3 months for re-evaluation; repeat A1C, BMP, and lipids.  At follow-up appointment, may wish to discuss dental exam, ophthalmology appt and/or podiatry evaluation.   , MHS, PA-C , MHS, PA-C Advanced Practice Provider [facility]  [phone] [place], [place] [facility] (p): [phone] [email or web address]

## 2019-11-13 ENCOUNTER — Other Ambulatory Visit: Payer: Managed Care, Other (non HMO)

## 2019-11-16 NOTE — Progress Notes (Signed)
Lennette Bihari, [place] (Attending) [phone] (Work) [phone] (Fax) [address] [place], [place] [address] Family Medicine  Turns 65 09/18/20 & established PCM with Lennette Bihari, PA.  Presents today for follow-up labs (A1c, BMP & Lipid Panel)  requested by Janalyn Harder, PA when he completed physical through [facility]. Has appointment scheduled for 11/26/19 to review lab results with Durward Parcel, PA-C  AMD

## 2019-11-19 ENCOUNTER — Other Ambulatory Visit: Payer: Self-pay

## 2019-11-19 ENCOUNTER — Ambulatory Visit: Payer: Managed Care, Other (non HMO)

## 2019-11-19 DIAGNOSIS — E785 Hyperlipidemia, unspecified: Secondary | ICD-10-CM

## 2019-11-19 DIAGNOSIS — E119 Type 2 diabetes mellitus without complications: Secondary | ICD-10-CM

## 2019-11-20 LAB — BASIC METABOLIC PANEL
BUN/Creatinine Ratio: 12 (ref 10–24)
BUN: 18 mg/dL (ref 8–27)
CO2: 25 mmol/L (ref 20–29)
Calcium: 9.9 mg/dL (ref 8.6–10.2)
Chloride: 102 mmol/L (ref 96–106)
Creatinine, Ser: 1.49 mg/dL — ABNORMAL HIGH (ref 0.76–1.27)
GFR calc Af Amer: 57 mL/min/{1.73_m2} — ABNORMAL LOW (ref >59)
GFR calc non Af Amer: 49 mL/min/{1.73_m2} — ABNORMAL LOW (ref >59)
Glucose: 97 mg/dL (ref 65–99)
Potassium: 4.4 mmol/L (ref 3.5–5.2)
Sodium: 139 mmol/L (ref 134–144)

## 2019-11-20 LAB — LIPID PANEL W/O CHOL/HDL RATIO
Cholesterol, Total: 92 mg/dL — ABNORMAL LOW (ref 100–199)
HDL: 37 mg/dL — ABNORMAL LOW (ref >39)
LDL Chol Calc (NIH): 36 mg/dL (ref 0–99)
Triglycerides: 97 mg/dL (ref 0–149)
VLDL Cholesterol Cal: 19 mg/dL (ref 5–40)

## 2019-11-20 LAB — HGB A1C W/O EAG: Hgb A1c MFr Bld: 6.7 % — ABNORMAL HIGH (ref 4.8–5.6)

## 2019-11-26 ENCOUNTER — Encounter: Payer: Self-pay | Admitting: Physician Assistant

## 2019-11-26 ENCOUNTER — Ambulatory Visit: Payer: Self-pay | Admitting: Physician Assistant

## 2019-11-26 ENCOUNTER — Other Ambulatory Visit: Payer: Self-pay

## 2019-11-26 VITALS — BP 142/74 | HR 64 | Temp 97.9°F | Resp 12 | Ht 71.0 in | Wt 210.0 lb

## 2019-11-26 DIAGNOSIS — Z712 Person consulting for explanation of examination or test findings: Secondary | ICD-10-CM

## 2019-11-26 NOTE — Progress Notes (Signed)
Labs (Lipid Panel, BMP & A1c) collected on 11/19/19.  AMD

## 2019-11-26 NOTE — Progress Notes (Signed)
   Subjective: Annual physical exam    Patient ID: Erik [name], male    DOB: 02-29-1956, 64 y.o.   MRN: [id number]  HPI Patient presents for annual physical exam.  Patient voices no concerns or complaints.   Review of Systems Diabetes, erectile dysfunction, hyperlipidemia, and hypertension.    Objective:   Physical Exam No acute distress.  HEENT is unremarkable.  Neck is supple for adenopathy or bruits.  Lungs clear to auscultation.  Heart regular rate and rhythm.  Abdomen with negative HSM, normoactive bowel sounds, soft and nontender to palpation.  No obvious deformity to the upper or lower extremities.  Patient has full equal range of motion upper extremities.  Patient decreased range of motion with squatting.  No obvious deformity to the cervical or lumbar spine.  Patient has full neck range of motion cervical lumbar spine.  Cranial nerves II through XII grossly intact.    Assessment & Plan: Well exam.  Discussed lab results with patient.  Advised continue previous medications.

## 2020-07-21 ENCOUNTER — Other Ambulatory Visit: Payer: Self-pay

## 2020-07-21 DIAGNOSIS — Z1152 Encounter for screening for COVID-19: Secondary | ICD-10-CM

## 2020-07-21 NOTE — Progress Notes (Signed)
Pt presents today requesting covid test due to coming back from out of town. CL,RMA

## 2020-07-23 LAB — SARS-COV-2, NAA 2 DAY TAT

## 2020-07-23 LAB — NOVEL CORONAVIRUS, NAA: SARS-CoV-2, NAA: NOT DETECTED
